# Patient Record
Sex: Female | Born: 1995 | Race: White | Hispanic: No | Marital: Single | Smoking: Never smoker
Health system: Southern US, Community
[De-identification: ages and names within clinical notes are randomized; demographics above are authoritative.]

---
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
## 2017-04-08 IMAGING — CT CT MAXILLOFACIAL W/O CM
4 series · 17 of 47 positions shown, 19 images · non-contrast
Comparison: None.

CLINICAL DATA: Slipped and fell on ice striking right side of head.
Bruising around the right eye. Positive loss of consciousness.
Vomiting.

EXAM:
CT HEAD WITHOUT CONTRAST
CT MAXILLOFACIAL WITHOUT CONTRAST
TECHNIQUE: Multidetector CT imaging of the head and maxillofacial structures
were performed using the standard protocol without intravenous
contrast. Multiplanar CT image reconstructions of the maxillofacial
structures were also generated.

[Series 2: head wo · axial · 0.37mm/px · z∈[-103,-9]mm · 6 of 30 slices shown, 8 images]
[im 5/30  brain]
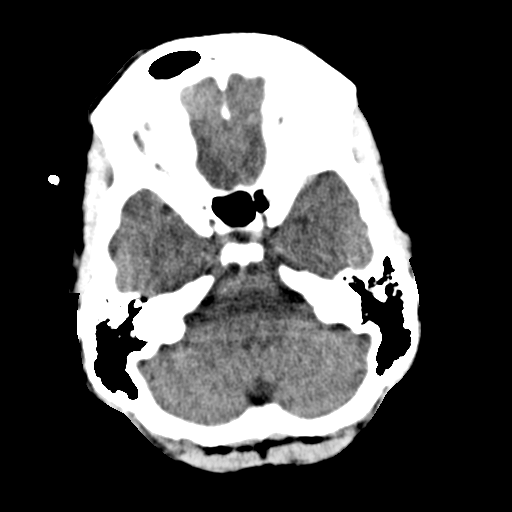
[im 5/30  bone]
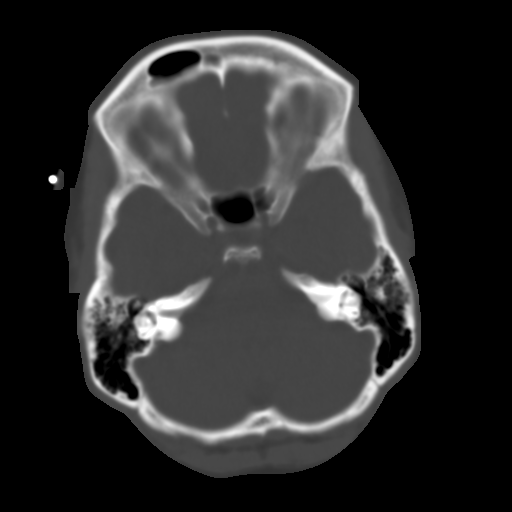
[im 9/30  bone]
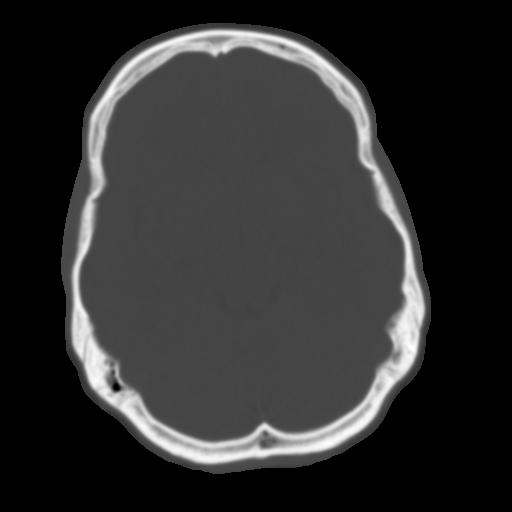
[im 13/30  bone]
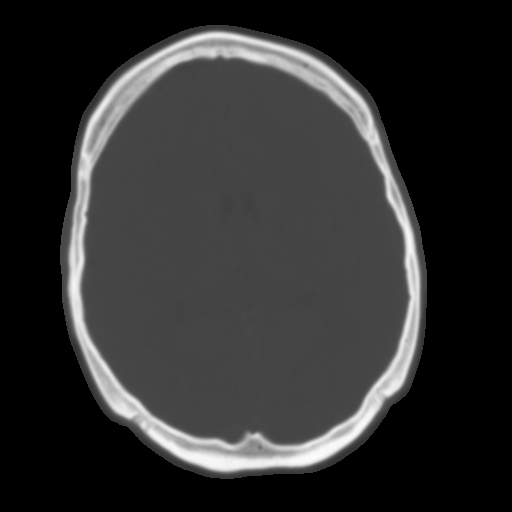
[im 17/30  bone]
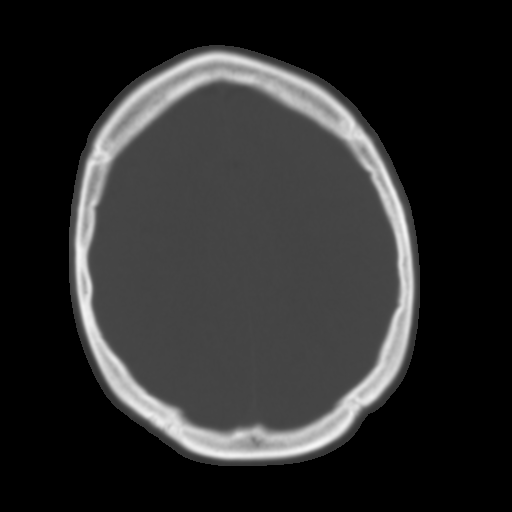
[im 21/30  brain]
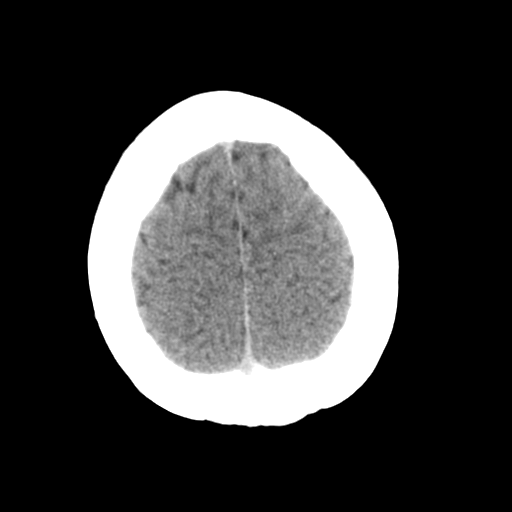
[im 21/30  bone]
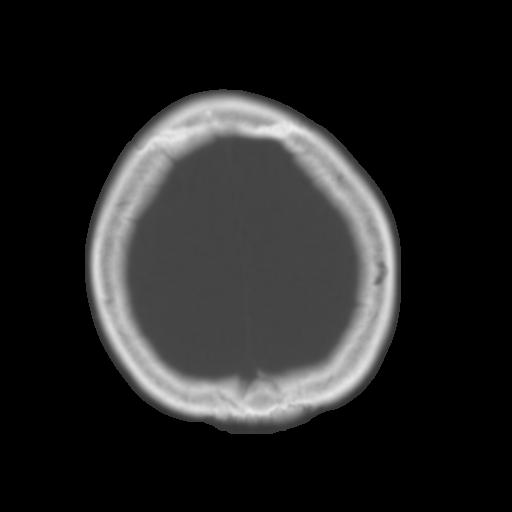
[im 25/30  bone]
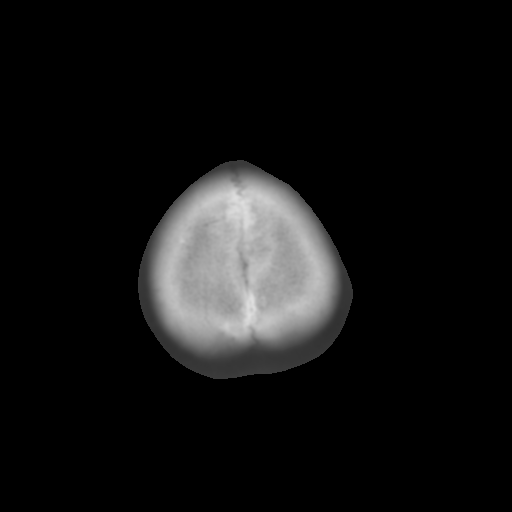

[Series 3: max soft · axial · 0.31mm/px · z∈[-217,-147]mm · 5 of 76 slices shown]
[im 8/76  brain]
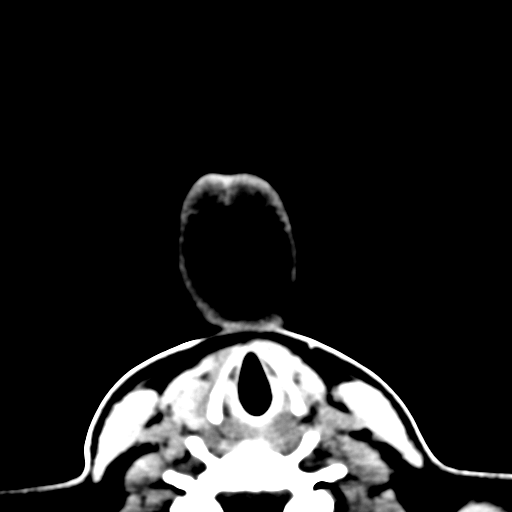
[im 15/76  brain]
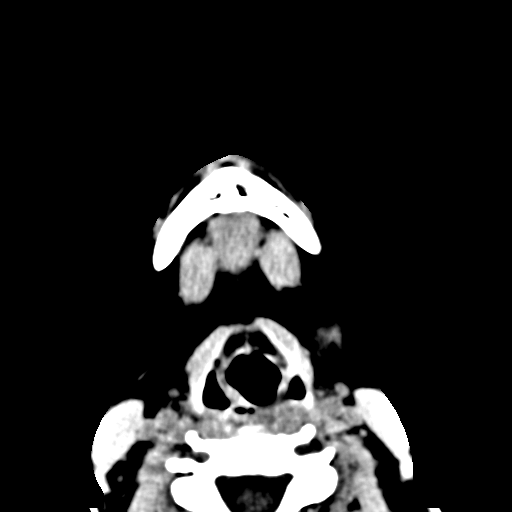
[im 26/76  brain]
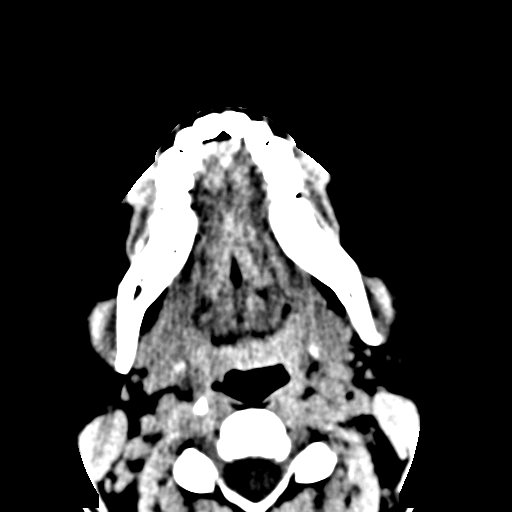
[im 33/76  brain]
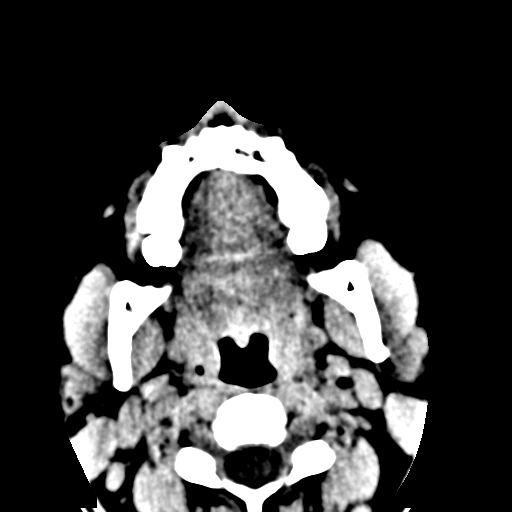
[im 43/76  brain]
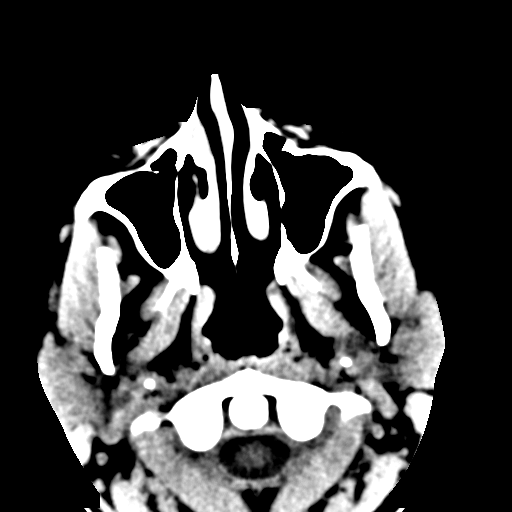

[Series 6: coronal soft · coronal · 0.31mm/px · 3 of 75 slices shown]
[im 25/75  bone]
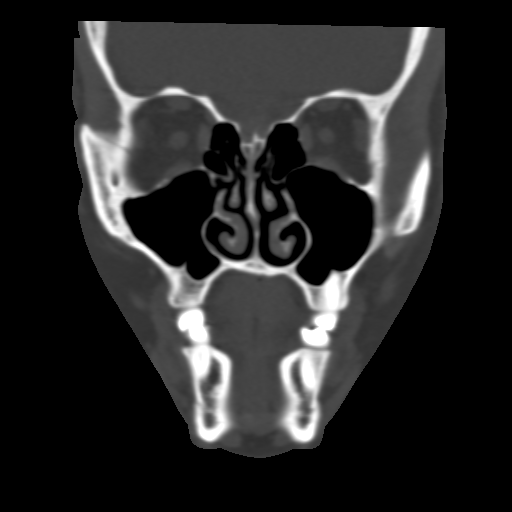
[im 33/75  bone]
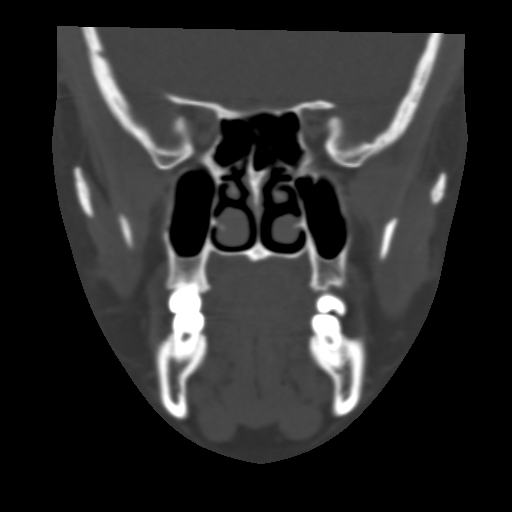
[im 42/75  bone]
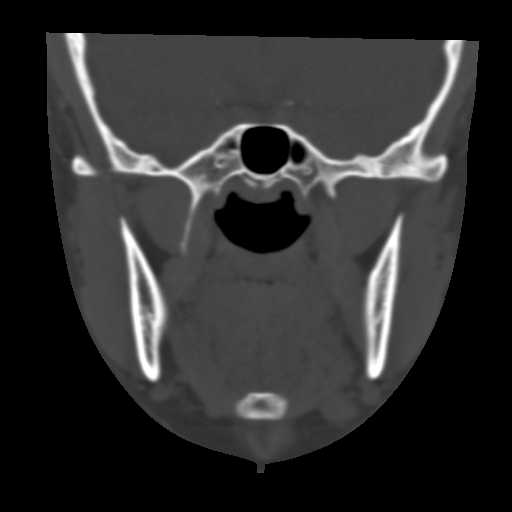

[Series 7: sagittal soft · sagittal · 0.31mm/px · 3 of 70 slices shown]
[im 24/70  bone]
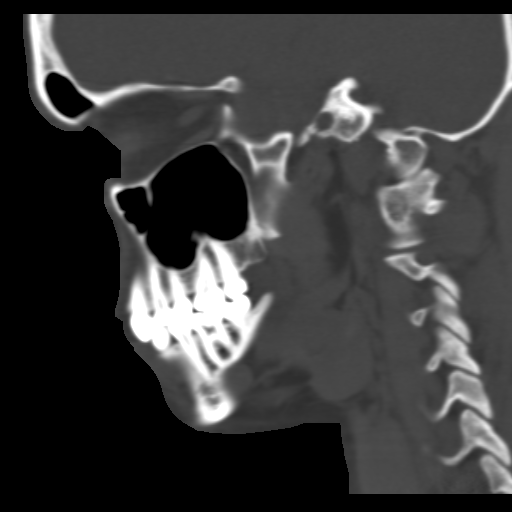
[im 35/70  bone]
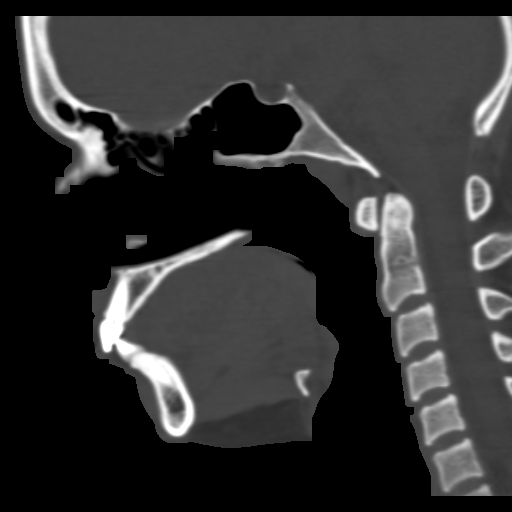
[im 47/70  bone]
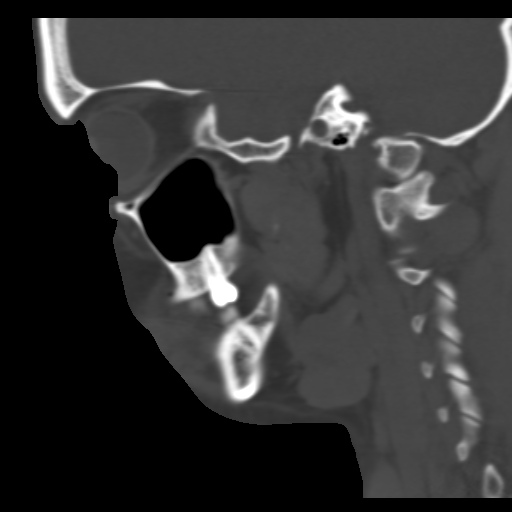

[17 of 47 positions shown; findings below may reference images not displayed]

FINDINGS: CT HEAD FINDINGS

There is no evidence for acute hemorrhage, hydrocephalus, mass
lesion, or abnormal extra-axial fluid collection. No definite CT
evidence for acute infarction. The visualized paranasal sinuses and
mastoid air cells are clear.

CT MAXILLOFACIAL FINDINGS

There is no evidence for basilar skull fracture. The mastoid air
cells and sphenoid sinuses are without evidence of fluid. No
evidence for fluid in either middle ear. The temporomandibular
joints are located. No evidence for mandible fracture. No maxillary
sinus fracture. Zygomatic arches are intact. No inferior or medial
orbital wall fracture.
IMPRESSION: 1. Normal CT head.
2. Normal maxillofacial CT.

## 2021-11-22 ENCOUNTER — Emergency Department
Admission: EM | Admit: 2021-11-22 | Discharge: 2021-11-22 | Disposition: A | Discharge status: Home / Self Care | Payer: TRICARE Prime—HMO | Attending: Emergency Medicine | Admitting: Emergency Medicine

## 2021-11-22 DIAGNOSIS — R0602 Shortness of breath: Secondary | ICD-10-CM | POA: Insufficient documentation

## 2021-11-22 DIAGNOSIS — R6889 Other general symptoms and signs: Secondary | ICD-10-CM

## 2021-11-22 DIAGNOSIS — R0789 Other chest pain: Secondary | ICD-10-CM | POA: Insufficient documentation

## 2021-11-22 DIAGNOSIS — I498 Other specified cardiac arrhythmias: Secondary | ICD-10-CM

## 2021-11-22 DIAGNOSIS — L989 Disorder of the skin and subcutaneous tissue, unspecified: Secondary | ICD-10-CM | POA: Insufficient documentation

## 2021-11-22 MED ORDER — LIDOCAINE-EPINEPHRINE 1 %-1:100000 IJ SOLN
30.0000 mL | Freq: Once | INTRAMUSCULAR | Status: DC
Start: 2021-11-22 — End: 2021-11-22

## 2021-11-22 MED ORDER — SULFAMETHOXAZOLE-TRIMETHOPRIM 800-160 MG PO TABS
2.0000 | ORAL_TABLET | Freq: Two times a day (BID) | ORAL | 0 refills | Status: AC
Start: 2021-11-22 — End: 2021-12-02

## 2021-11-22 MED ORDER — LIDOCAINE-EPINEPHRINE 2 %-1:100000 IJ SOLN
10.0000 mL | Freq: Once | INTRAMUSCULAR | Status: AC
Start: 2021-11-22 — End: 2021-11-22
  Administered 2021-11-22: 10 mL via INTRADERMAL
  Filled 2021-11-22: qty 20

## 2021-11-22 MED ORDER — CEPHALEXIN 500 MG PO CAPS
500.0000 mg | ORAL_CAPSULE | Freq: Four times a day (QID) | ORAL | 0 refills | Status: AC
Start: 2021-11-22 — End: 2021-12-02

## 2021-11-22 NOTE — ED Provider Notes (Signed)
EMERGENCY DEPARTMENT HISTORY AND PHYSICAL EXAM     None      Date: 11/22/2021   Patient Name: Connie Winters  Attending Physician: Luz Brazen, MD   Advanced Practice Provider: Elon Spanner., PA    History of Presenting Illness     Chief Complaint:  Chief Complaint   Patient presents with    Abscess    Back Pain     HPI: Connie Winters is a pleasant 26 y.o. female who  has no past medical history on file. (per Epic records), patient reports few days of tenderness and swelling at the top of her gluteal crease, has had similar episodes in the past with some drainage from a small skin lesion in the area.  Skin lesion now darker color which is atypical.  Patient also reports some low sternal chest discomfort, mild exertional shortness of breath.  Apple watch intermittently shows tachycardia, max rate of 107, not consistent.  Does not report fever, nausea vomiting, history of PE DVT, trauma, smoking history, cardiac history, symptoms in her lower extremities.    PCP: Pcp, None, MD  Physical exam   Pulse 72  BP 138/84  Resp 18  SpO2 98 %  Temp 98.3 F (36.8 C)  Vitals:    11/22/21 1835   BP: 138/84   Pulse: 72   Resp: 18   Temp: 98.3 F (36.8 C)   TempSrc: Oral   SpO2: 98%   Weight: 74.8 kg   Height: '5\' 5"'$  (1.651 m)       Physical Exam    Constitutional: Pt appears well-developed and well-nourished.   .    Head:      Eyes:      ENT:   .     Neck/Back:     Pulmonary/Chest: Effort normal.  CTA, lower sternum tender to palpation.    Cardiovascular:  No pallor.     Abdomen/Pelvic:   .    Neurological: Pt is alert.  Marland Kitchen    Psychiatric: Pt is lucid.    Anxious    Skin: Skin is intact unless otherwise noted.  Approximately 5 mm darkened ?Nevus superior gluteal cleft.  Noncontiguous area just distal on L buttock of induration, mild soft tissue swelling/prominence, questionable fluctuance.  No erythema of either area.    MSK: Gross sensory and motor is intact unless otherwise noted.     Extremity:    Extremity:     Present Absent   Present Absent   Tenderness    Tenderness     STS    STS     Deformity    Deformity     Effusion    Effusion      Normal Abnorm   Normal Abnorm   ROM    ROM     Stability    Stability     Distal NV    Distal NV               Past History   Past Medical History:  History reviewed. No pertinent past medical history.  Past Surgical History:  History reviewed. No pertinent surgical history.  Family/Social History:  She reports that she has never smoked. She has never used smokeless tobacco. No history on file for alcohol use and drug use.    History reviewed. No pertinent family history.    Allergies:  No Known Allergies    Listed Medications on Arrival:  Home Medications  Med List Status: In Progress Set By: Iran Ouch, RN at 11/22/2021  6:48 PM   No Medications           Diagnostic Study Results   Labs -     Results       ** No results found for the last 24 hours. **            Radiologic Studies -   Radiology Results (24 Hour)       ** No results found for the last 24 hours. **        .      Procedures     Procedures:   Incision/Drainage    Date/Time: 12/03/2021 8:12 AM    Performed by: Elon Spanner., PA  Authorized by: Luz Brazen, MD    Location:     Location: L buttock.  Pre-procedure details:     Procedure prep: etoh pad.  Anesthesia:     Anesthesia method:  Local infiltration    Local anesthetic:  Lidocaine 2% WITH epi  Procedure details:     Needle aspiration: yes      Needle size:  18 G    Drainage amount: none.  Post-procedure details:     Procedure completion:  Tolerated well, no immediate complications    Please note the above procedure is a needle aspiration. Due to restrictions in the EMR the "incision and drainage" title of this procedure could not be edited.      Medical Decision Making   I reviewed the vital signs, available nursing notes, past medical history, past surgical history, family history, social history, and this visit's labs and  radiology listed in this note.    Medications given in the ED:  ED Medication Orders (From admission, onward)      Start Ordered     Status Ordering Provider    11/22/21 1920 11/22/21 1919  lidocaine-EPINEPHrine (XYLOCAINE W/EPI) 2 %-1:100000 injection 10 mL  Once        Route: Intradermal  Ordered Dose: 10 mL       Last MAR action: Given by Other Lylie Blacklock, [place].    11/22/21 1913 11/22/21 1912    Once        Route: Intradermal  Ordered Dose: 30 mL       Discontinued Dacey Milberger J JR.            Medications Prescribed: (Note: any '600mg'$  or '800mg'$  ibuprofen tablets are prescription dosing, not OTC)  Discharge Prescriptions       Medication Sig Dispense Auth. Provider    cephALEXin (KEFLEX) 500 MG capsule Take 1 capsule (500 mg) by mouth 4 (four) times daily for 10 days 40 capsule Monaye Blackie, Unknown Foley., PA    sulfamethoxazole-trimethoprim (BACTRIM DS) 800-160 MG per tablet Take 2 tablets by mouth 2 (two) times daily for 10 days 40 tablet Kamoni Gentles, Unknown Foley., PA            Clinical Decision Support:       ED Course:        Vital Signs- (reviewed)   Patient Vitals for the past 12 hrs:   BP Temp Pulse Resp   11/22/21 1835 138/84 98.3 F (36.8 C) 72 18       Provider Notes:    27 y.o. female presents with area of acute soft tissue swelling and tenderness on her left superior gluteus.  Considered pilonidal abscess though would be atypical given absence of  erythema, large needle aspiration confirms absence of abscess at this time.  Will cover for soft tissue infection/cellulitis, patient understands may progress to abscess that would need to be drained.  Return precautions discussed.    ?Nevus with suspicious characteristics, referral given to Derm, patient understands to follow-up.    Patient mentions some shortness of breath and anterior chest discomfort.  Do not believe is reflective of PE or ACS given pain reproducible on palpation, risk factors, tachycardia being intermittent, no tachycardia here, absence of  additional symptoms.  Suspect costochondritis, recommend OTC ibuprofen.     Though other pathology possible, pt is without current signs of instability, medical urgency or emergency.  Pt well appearing at discharge.  Pt/caregiver understands possibility of progression of disease, need for continued care and assessment outpatient.  Aftercare and return precautions discussed.    Diagnosis     Clinical Impression:   1. Suspected soft tissue infection    2. Skin lesion of back        Treatment Plan:   ED Disposition       ED Disposition   Discharge    Condition   --    Date/Time   Wed Nov 22, 2021  7:45 PM    Comment   Connie Winters discharge to home/self care.    Condition at disposition: Stable                 Followup: (See discharge instructions if not listed here)     This note was completed using dragon medical speech recognition software. Grammatical errors, random word insertions, pronoun errors, incorrect word insertion, misspellings and incomplete sentences are occasional consequences of this technology due to software limitations. If there are questions or concerns about the content of this note or information contained within the body of this dictation they should be addressed with the provider for clarification. This chart may have been refreshed after admission or discharge, and therefore some results may not have been available for my review at the time of my involvement with the patient.  _____________________________    CHART OWNERSHIP: I, Carola Frost, PA-C, am the primary clinician of record.     Elon Spanner., PA  11/22/21 2003       Luz Brazen, MD  11/22/21 2027       Elon Spanner., PA  12/03/21 0817       Maryan Rued Unknown Foley., PA  12/03/21 [id number]       Luz Brazen, MD  12/03/21 [phone]

## 2021-11-22 NOTE — Discharge Instructions (Signed)
Return (or call 911 as needed) if the patient's symptoms change, new/additional symptoms begin, or if you feel the patient is getting worse in general, or for any other concerns.  Always follow up with an outpatient provider as directed.          The goal of the ER visit is establishing medically stability and to reasonably rule out emergency conditions that pose a significant immediate threat to your well-being. Fortunately based on our evaluation, you do not appear to be in immediate danger; however, we are unable to definitively diagnose the specific cause of your symptoms today. Please continue the investigation into the cause of your symptoms with outpatient providers as instructed (see referral).  Return at any time for a reevaluation if you are concerned.     Infection precautions:  Seek medical attention for fever, pus, increasing redness, increasing pain or other signs of worsening infection.

## 2021-11-23 LAB — ECG 12-LEAD
Atrial Rate: 69 {beats}/min
IHS MUSE NARRATIVE AND IMPRESSION: NORMAL
P Axis: 47 degrees
P-R Interval: 186 ms
Q-T Interval: 390 ms
QRS Duration: 84 ms
QTC Calculation (Bezet): 417 ms
R Axis: 76 degrees
T Axis: 62 degrees
Ventricular Rate: 69 {beats}/min
